# Patient Record
Sex: Male | Born: 1975 | Race: White | Hispanic: No | Marital: Married | Smoking: Never smoker
Health system: Southern US, Community
[De-identification: ages and names within clinical notes are randomized; demographics above are authoritative.]

## PROBLEM LIST (undated history)

## (undated) DIAGNOSIS — F419 Anxiety disorder, unspecified: Secondary | ICD-10-CM

---

## 2013-09-10 ENCOUNTER — Encounter (HOSPITAL_COMMUNITY): Payer: Self-pay | Admitting: Emergency Medicine

## 2013-09-10 ENCOUNTER — Emergency Department (HOSPITAL_COMMUNITY)
Admission: EM | Admit: 2013-09-10 | Discharge: 2013-09-10 | Disposition: A | Discharge status: Home / Self Care | Payer: BC Managed Care – PPO | Attending: Emergency Medicine | Admitting: Emergency Medicine

## 2013-09-10 DIAGNOSIS — G4700 Insomnia, unspecified: Secondary | ICD-10-CM

## 2013-09-10 DIAGNOSIS — F411 Generalized anxiety disorder: Secondary | ICD-10-CM | POA: Insufficient documentation

## 2013-09-10 DIAGNOSIS — Z79899 Other long term (current) drug therapy: Secondary | ICD-10-CM | POA: Insufficient documentation

## 2013-09-10 HISTORY — DX: Anxiety disorder, unspecified: F41.9

## 2013-09-10 LAB — CBG MONITORING, ED: GLUCOSE-CAPILLARY: 89 mg/dL (ref 70–99)

## 2013-09-10 MED ORDER — ZOLPIDEM TARTRATE 10 MG PO TABS: 10.0000 mg | ORAL_TABLET | Freq: Every evening | ORAL | Status: DC | PRN

## 2013-09-10 MED ORDER — LORAZEPAM 1 MG PO TABS: 1.0000 mg | ORAL_TABLET | Freq: Three times a day (TID) | ORAL | Status: DC | PRN

## 2013-09-10 NOTE — ED Notes (Signed)
Per pt, has changed jobs recently in last week.  Pt has had extreme stress and anxiety.  Pt has multiple situations.  Pregnant wife due in 4 weeks, 3 moves in 1 month, child failing school, insomnia, dry mouth.  Hasn't slept in 2 days.  Not eating/drinking well. Hates new job.

## 2013-09-10 NOTE — ED Provider Notes (Signed)
CSN: [id number]     Arrival date & time 09/10/13  1234 History  This chart was scribed for non-physician practitioner Ebbie Ridge, PA-C working with Elwin Mocha, MD by Leone Payor, ED Scribe. This patient was seen in room WTR9/WTR9 and the patient's care was started at 2:34 PM.    Chief Complaint  Patient presents with  . Anxiety  . Insomnia    The history is provided by the patient and the spouse. No language interpreter was used.    HPI Comments: Jamie Thomas is a 38 y.o. male who presents to the Emergency Department complaining of constant, gradually worsened anxiety and insomnia for the past several weeks. Patient states he has been unable to sleep well for several days and no sleep for the past 2 days. Patient has been self-medicating with melatonin and Xanax 0.5 mg without relief. Patient states he recently moved from [place] and had to move 3 times in the past 1 month. He states his wife is 8.5 months pregnant and has another child who is not doing well in school. He reports a decreased appetite and thirst. He states yesterday he felt fatigued with associated lightheadedness and paresthesias to hands. Patient states he used to drink 1-1.5 bottles of wine nightly for many years, which he quit doing 1 week ago. He denies chest pain.   Past Medical History  Diagnosis Date  . Anxiety    History reviewed. No pertinent past surgical history. History reviewed. No pertinent family history. History  Substance Use Topics  . Smoking status: Never Smoker   . Smokeless tobacco: Not on file  . Alcohol Use: Yes     Comment: quit x 1 week, was drinking a bottle of wine per day    Review of Systems  A complete 10 system review of systems was obtained and all systems are negative except as noted in the HPI and PMH.    Allergies  Review of patient's allergies indicates no known allergies.  Home Medications   Prior to Admission medications   Medication Sig Start Date End Date Taking?  Authorizing Provider  ALPRAZolam Prudy Feeler) 0.5 MG tablet Take 0.5 mg by mouth at bedtime as needed for anxiety or sleep.   Yes Historical Provider, MD  ibuprofen (ADVIL,MOTRIN) 200 MG tablet Take 200 mg by mouth every 6 (six) hours as needed for moderate pain.   Yes Historical Provider, MD  Melatonin 3 MG CAPS Take 6 mg by mouth at bedtime as needed (for sleep).   Yes Historical Provider, MD   BP 126/93  Pulse 67  Temp(Src) 97.6 F (36.4 C) (Oral)  Resp 18  SpO2 100% Physical Exam  Nursing note and vitals reviewed. Constitutional: He is oriented to person, place, and time. He appears well-developed and well-nourished.  HENT:  Head: Normocephalic and atraumatic.  Right Ear: External ear normal.  Left Ear: External ear normal.  Cardiovascular: Normal rate, regular rhythm and normal heart sounds.   Pulmonary/Chest: Effort normal and breath sounds normal. No respiratory distress. He has no wheezes. He has no rales.  Abdominal: He exhibits no distension.  Neurological: He is alert and oriented to person, place, and time.  Skin: Skin is warm and dry.  Psychiatric: He has a normal mood and affect. His behavior is normal. Judgment and thought content normal.    ED Course  Procedures (including critical care time)  DIAGNOSTIC STUDIES: Oxygen Saturation is 100% on RA, normal by my interpretation.    COORDINATION OF CARE: 3:02 PM Discussed  treatment plan with pt at bedside and pt agreed to plan.   Labs Review Labs Reviewed  CBG MONITORING, ED    I personally performed the services described in this documentation, which was scribed in my presence. The recorded information has been reviewed and is accurate.    Carlyle DollyChristopher W Amiya Escamilla, PA-C 09/12/13 2303

## 2013-09-10 NOTE — Discharge Instructions (Signed)
Return here as needed. Follow up with a primary care doctor. ° ° ° °Emergency Department Resource Guide °1) Find a Doctor and Pay Out of Pocket °Although you won't have to find out who is covered by your insurance plan, it is a good idea to ask around and get recommendations. You will then need to call the office and see if the doctor you have chosen will accept you as a new patient and what types of options they offer for patients who are self-pay. Some doctors offer discounts or will set up payment plans for their patients who do not have insurance, but you will need to ask so you aren't surprised when you get to your appointment. ° °2) Contact Your Local Health Department °Not all health departments have doctors that can see patients for sick visits, but many do, so it is worth a call to see if yours does. If you don't know where your local health department is, you can check in your phone book. The CDC also has a tool to help you locate your state's health department, and many state websites also have listings of all of their local health departments. ° °3) Find a Walk-in Clinic °If your illness is not likely to be very severe or complicated, you may want to try a walk in clinic. These are popping up all over the country in pharmacies, drugstores, and shopping centers. They're usually staffed by nurse practitioners or physician assistants that have been trained to treat common illnesses and complaints. They're usually fairly quick and inexpensive. However, if you have serious medical issues or chronic medical problems, these are probably not your best option. ° °No Primary Care Doctor: °- Call [facility] at  832-8000 - they can help you locate a primary care doctor that  accepts your insurance, provides certain services, etc. °- Physician Referral Service- 1-800-533-3463 ° °Chronic Pain Problems: °Organization         Address  Phone   Notes  °[facility]  (336) 297-2271 Patients need to  be referred by their primary care doctor.  ° °Medication Assistance: °Organization         Address  Phone   Notes  °[place] [facility] [address] °[place], [place] [address] (336) 641-8030 --Must be a resident of [place] °-- Must have NO insurance coverage whatsoever (no Medicaid/ Medicare, etc.) °-- The pt. MUST have a primary care doctor that directs their care regularly and follows them in the community °  °[facility]  (866) 331-1348   °[employer]  (888) 892-1162   ° °Agencies that provide inexpensive medical care: °Organization         Address  Phone   Notes  °[facility]  (336) 832-8035   °[facility]    (336) 832-7272   °[facility] [address] °[place], [place] [address] (336) 832-4777   °[facility] [address], °[place] (336) 271-4999   °[facility]    (336) 373-0678   °[facility]    (336) 272-1050   °[facility] ° [address], [place] Phone:  (336) 832-4444, Fax:  (336) 832-4440 Hours of Operation:  9 am - 6 pm, M-F.  Also accepts Medicaid/Medicare and self-pay.  ° [facility] ° [address],  Phone: (336) 832-3150, Fax: (336) 832-3151. Hours of Operation:  8:30 am -   5:30 pm, M-F.  Also accepts Medicaid and self-pay.  °[facility] [place] [address], [place] Phone: (336) 878-6027   °[facility] [address], [place], [place] (336)723-1848, Ext. 123 Mondays & Thursdays: 7-9 AM.  First 15 patients are seen on a first come, first serve basis. °  ° °Medicaid-accepting [place] Providers: ° °Organization         Address  Phone   Notes  °[facility] [address], [place] (336) 641-2100 Also accepts self-pay patients.  °[facility] [address], [place] ° (336) 856-9996   °[facility] [address], [place] (336) 288-8857   °[facility] [address] [place] [address], [place] (336) 299-7000   °Veita Bland [address], [place]  ° (336) 373-1557 Only accepts [place] Access Medicaid patients after they have their name applied to their card.  ° °Self-Pay (no insurance) in [place]: ° °Organization         Address  Phone   Notes  °Sickle Cell Patients, [facility] [address], [place] (336) 832-1970   °[facility] [address], [place] (336) 832-4400   °[facility] ° [address], [place] (336) 992-4800   °[facility]/Dr. Osei-Bonsu ° [address] [place] [address], [place] or [address], [place] (336) 841-8500 Phone number for both [place] and [place] locations is the same.  °[facility] [address], [place] (336) 299-0000   °[facility] [address] [place] [address], [place] or [address] (336) 852-7530 °(336) 878-2260   °[facility] [address], [place] (336) 350-1642, phone; (336) 294-5005, fax Sees patients 1st and 3rd Saturday of every month.  Must not qualify for public or private insurance (i.e. Medicaid, Medicare, [place] Health Choice, Veterans' Benefits) • Household income should be no more than 200% of the poverty level •The clinic cannot treat you if you are pregnant or think you are pregnant • Sexually transmitted diseases are not treated at the clinic.  ° ° °Dental Care: °Organization         Address  Phone  Notes  °[facility] [address], [place] (336) 641-6152 Accepts children up to age 21 who are enrolled in Medicaid or [place] Health Choice; pregnant women with a Medicaid card; and children who have applied for Medicaid or [place] Health Choice, but were declined, whose parents can  pay a reduced fee at time of service.  °[facility] [place]  [address], [place] (336) 641-7733 Accepts children up to age 21 who are enrolled in Medicaid or [place] Health Choice; pregnant women with a Medicaid card; and children who have applied for Medicaid or  Health Choice, but were declined, whose parents can pay a reduced fee at time of service.  °[facility] ° [address],  (336) 641-4533 Patients are seen by appointment only. Walk-ins are not accepted. [facility] will see patients 18 years of age and older. °Monday - Tuesday (8am-5pm) °Most Wednesdays (8:30-5pm) °$30 per visit, cash only  °[facility] ° [address], [place] (336) 641-4533 Patients are seen by appointment only. Walk-ins are   not accepted. [facility] will see patients 18 years of age and older. °One Wednesday Evening (Monthly: Volunteer Based).  $30 per visit, cash only  °[facility]  (919) 537-3737 for adults; Children under age 4, call Graduate Pediatric Dentistry at (919) 537-3956. Children aged 4-14, please call (919) 537-3737 to request a pediatric application. ° Dental services are provided in all areas of dental care including fillings, crowns and bridges, complete and partial dentures, implants, gum treatment, root canals, and extractions. Preventive care is also provided. Treatment is provided to both adults and children. °Patients are selected via a lottery and there is often a waiting list. °  °[facility] [address], °[place] ° (336) 763-8833 [email or web address] °  °[facility] [address], [place], [place] (336)723-1848, Ext. 123 Second and Fourth Thursday of each month, opens at 6:30 AM; Clinic ends at 9 AM.  Patients are seen on a first-come first-served basis, and a limited number are seen during each clinic.  ° °[facility] ° [address], [place], [place] (336) 723-7904   Eligibility Requirements °You must have lived in [place], [place], or [place] counties for at least the last three months. °  You cannot be eligible for state or federal sponsored healthcare insurance, including Veterans Administration, Medicaid, or Medicare. °  You generally cannot be eligible for healthcare insurance through your employer.  °  How to apply: °Eligibility screenings are held every Tuesday and Wednesday afternoon from 1:00 pm until 4:00 pm. You do not need an appointment for the interview!  °[facility] [address], [place], [place] 336-631-2330   °[facility]  336-342-8273   °[facility]  336-703-3100   °[facility]  336-570-6415   ° °Behavioral Health Resources in the Community: °Intensive Outpatient Programs °Organization         Address  Phone  Notes  °[facility] [address], [place], [place] 336-878-6098   °[facility] [address], [place], [place] 336-832-9800   °[facility] [address], [place], [place] ° 336-882-2125   °[facility] [address],  °[place], [place] 1-800-853-5163 or 336-641-4981   °Substance Abuse Resources °Organization         Address  Phone  Notes  °[facility]  336-882-2125   °[facility]  336-784-9470   °[facility]  336-285-9073   °[facility]  336-845-3988   °Residential & Outpatient Substance Abuse Program  1-800-659-3381   °Psychological Services °Organization         Address  Phone  Notes  °[facility]  336- 832-9600   °[facility]  336- 378-7881   °[facility] [address], [place] 1-800-853-5163 or 336-641-4981   ° °Mobile Crisis Teams °Organization         Address  Phone  Notes  °[facility], Mobile Crisis Care Unit  1-877-626-1772    °Assertive °[facility] ° [address] [place], [place] 336-834-9664   °Sharon DeEsch [address] °[place] 336-554-5454   ° °Self-Help/Support Groups °Organization         Address  Phone             Notes  °[facility] - variety of support groups  336- 373-1402 Call for more information  °Narcotics Anonymous (NA),   [facility] [address], °[place]  2 meetings at this location  ° °Residential Treatment Programs °Organization         Address  Phone  Notes  °[facility] [address],    °[place]  1-866-801-8205   °[facility] ° [address], [place], [place] 704-293-8524   °[facility] [address], [place] 336-845-3988 Admissions: 8am-3pm M-F  °[facility] [address]    °[place], [place] 336-841-1104   °[facility] [address], [place], [place] 336-379-7146   °[facility] [address]  °[place], [place] 336-285-9073   °[facility] Intensive Outpatient [address], [place], [place] 336-852-3033   °[facility] [address]  °[place], [place] 1-877-615-2722 or 336-784-9470   °[facility] [address] [place], [place] 336-227-7417 Accepts Medicaid  °[facility] [address]  °[place] 1-800-659-3381 Substance Abuse/Addiction Treatment  ° °[place] Behavioral Health Resources °Organization         Address  Phone  Notes  °[facility]  (888) 581-9988   °Julie Brannon, PhD [address] [place], [place]   (336) 349-5553 or (336) 951-0000   °[facility]   [address] °[place], [place] (336) 349-4454   °[facility] [address], [place], [place] (336) 342-8316 Insurance/Medicaid/sponsorship through [facility]  °[facility] [address]                                     [place], [place] (336) 342-8316 Therapy/tele-psych/case  °[facility] [address]  ° [place], [place] (336) 349-2233    °Dr. Arfeen  (336) 349-4544   °[facility] [place]  [employer] [place] [facility] 1) [address], [place] °2) [address], [place] °3)  [address], [place] (336) 349-3220 °(336) 342-7768 ° °(336) 342-8140   °[place] [facility] (336) 342-1394 or (336) 342-3537 (After Hours)    ° ° ° °

## 2013-09-14 NOTE — ED Provider Notes (Signed)
Medical screening examination/treatment/procedure(s) were performed by non-physician practitioner and as supervising physician I was immediately available for consultation/collaboration.   EKG Interpretation None        Jamarcus Laduke, MD 09/14/13 1122 

## 2013-11-10 DIAGNOSIS — F419 Anxiety disorder, unspecified: Secondary | ICD-10-CM | POA: Insufficient documentation

## 2014-02-12 ENCOUNTER — Ambulatory Visit: Payer: Self-pay | Admitting: Podiatry

## 2014-03-09 ENCOUNTER — Encounter: Payer: Self-pay | Admitting: Podiatry

## 2014-03-09 ENCOUNTER — Ambulatory Visit (INDEPENDENT_AMBULATORY_CARE_PROVIDER_SITE_OTHER): Payer: BLUE CROSS/BLUE SHIELD

## 2014-03-09 ENCOUNTER — Ambulatory Visit (INDEPENDENT_AMBULATORY_CARE_PROVIDER_SITE_OTHER): Payer: BLUE CROSS/BLUE SHIELD | Admitting: Podiatry

## 2014-03-09 ENCOUNTER — Other Ambulatory Visit: Payer: Self-pay | Admitting: Podiatry

## 2014-03-09 VITALS — BP 126/76 | HR 67 | Resp 16

## 2014-03-09 DIAGNOSIS — M779 Enthesopathy, unspecified: Secondary | ICD-10-CM

## 2014-03-09 DIAGNOSIS — T560X1A Toxic effect of lead and its compounds, accidental (unintentional), initial encounter: Secondary | ICD-10-CM

## 2014-03-09 DIAGNOSIS — M1A1720 Lead-induced chronic gout, left ankle and foot, without tophus (tophi): Secondary | ICD-10-CM

## 2014-03-09 DIAGNOSIS — M1A1710 Lead-induced chronic gout, right ankle and foot, without tophus (tophi): Secondary | ICD-10-CM

## 2014-03-09 DIAGNOSIS — M2020 Hallux rigidus, unspecified foot: Secondary | ICD-10-CM

## 2014-03-09 MED ORDER — TRIAMCINOLONE ACETONIDE 10 MG/ML IJ SUSP
10.0000 mg | Freq: Once | INTRAMUSCULAR | Status: AC
  Administered 2014-03-09: 10 mg

## 2014-03-09 MED ORDER — PREDNISONE 10 MG PO TABS: ORAL_TABLET | ORAL | Status: DC

## 2014-03-09 NOTE — Progress Notes (Signed)
   Subjective:    Patient ID: Jamie Thomas, male    DOB: 12-22-1975, 39 y.o.   MRN: [id number]  HPI Comments: "I have pain in this foot"  Patient c/o aching 1st MPJ right for few days. He states that he has episodes of pain. The area today is red and swollen. He said on a pain scale last night would have been a 15. He tired icy hot and soaks.  Foot Pain Associated symptoms include arthralgias.      Review of Systems  Musculoskeletal: Positive for arthralgias and gait problem.  All other systems reviewed and are negative.      Objective:   Physical Exam        Assessment & Plan:

## 2014-03-09 NOTE — Progress Notes (Signed)
Subjective:     Patient ID: Jamie CuminsStephen Thomas, male   DOB: [birth year], 39 y.o.   MRN: [id number]  HPI patient states that he's had numerous issues with his big toe joints of both feet and also his ankles especially for the last 4 months. States he does not remember when it started but it's been really bothering me does have a history of gout and has been given a tentative diagnosis of gout but is not done blood work for a long period of time   Review of Systems  All other systems reviewed and are negative.      Objective:   Physical Exam  Constitutional: He is oriented to person, place, and time.  Cardiovascular: Intact distal pulses.   Musculoskeletal: Normal range of motion.  Neurological: He is oriented to person, place, and time.  Skin: Skin is warm.  Nursing note and vitals reviewed.  neurovascular status intact with muscle strength adequate and range of motion of the subtalar and midtarsal joint within normal limits. 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 I am going to send him for extensive blood work to rule out systemic pathology and today I did inject the right first MPJ 3 mg Kenalog 5 mg Xylocaine and then went ahead and placed on a sterile prep 12 day DS Dosepak. Reappoint 2 weeks to review blood work and see how he responded to medicine and to discuss the hallux limitus deformity

## 2014-03-23 ENCOUNTER — Ambulatory Visit: Payer: BLUE CROSS/BLUE SHIELD | Admitting: Podiatry

## 2014-03-23 LAB — ANA: ANA: NEGATIVE

## 2014-03-23 LAB — CBC WITH DIFFERENTIAL/PLATELET
BASOS ABS: 0.1 10*3/uL (ref 0.0–0.1)
Basophils Relative: 1 % (ref 0–1)
Eosinophils Absolute: 0.1 10*3/uL (ref 0.0–0.7)
Eosinophils Relative: 1 % (ref 0–5)
HCT: 47.3 % (ref 39.0–52.0)
Hemoglobin: 16 g/dL (ref 13.0–17.0)
Lymphocytes Relative: 34 % (ref 12–46)
Lymphs Abs: 3.2 10*3/uL (ref 0.7–4.0)
MCH: 30.7 pg (ref 26.0–34.0)
MCHC: 33.8 g/dL (ref 30.0–36.0)
MCV: 90.8 fL (ref 78.0–100.0)
MPV: 9.6 fL (ref 8.6–12.4)
Monocytes Absolute: 0.8 10*3/uL (ref 0.1–1.0)
Monocytes Relative: 8 % (ref 3–12)
NEUTROS ABS: 5.3 10*3/uL (ref 1.7–7.7)
NEUTROS PCT: 56 % (ref 43–77)
PLATELETS: 260 10*3/uL (ref 150–400)
RBC: 5.21 MIL/uL (ref 4.22–5.81)
RDW: 13.9 % (ref 11.5–15.5)
WBC: 9.4 10*3/uL (ref 4.0–10.5)

## 2014-03-23 LAB — URIC ACID: URIC ACID, SERUM: 8.3 mg/dL — AB (ref 4.0–7.8)

## 2014-03-23 LAB — SEDIMENTATION RATE: Sed Rate: 1 mm/hr (ref 0–16)

## 2014-03-23 LAB — RHEUMATOID FACTOR: Rhuematoid fact SerPl-aCnc: <10 IU/mL (ref <=14)

## 2014-03-23 LAB — C-REACTIVE PROTEIN

## 2014-03-30 ENCOUNTER — Encounter: Payer: Self-pay | Admitting: Podiatry

## 2014-03-30 ENCOUNTER — Ambulatory Visit (INDEPENDENT_AMBULATORY_CARE_PROVIDER_SITE_OTHER): Payer: BLUE CROSS/BLUE SHIELD | Admitting: Podiatry

## 2014-03-30 VITALS — BP 124/77 | HR 78 | Resp 16

## 2014-03-30 DIAGNOSIS — M2020 Hallux rigidus, unspecified foot: Secondary | ICD-10-CM

## 2014-03-30 DIAGNOSIS — M1A1710 Lead-induced chronic gout, right ankle and foot, without tophus (tophi): Secondary | ICD-10-CM

## 2014-03-30 DIAGNOSIS — T560X1A Toxic effect of lead and its compounds, accidental (unintentional), initial encounter: Secondary | ICD-10-CM

## 2014-03-30 DIAGNOSIS — M779 Enthesopathy, unspecified: Secondary | ICD-10-CM

## 2014-03-30 MED ORDER — ALLOPURINOL 100 MG PO TABS: 100.0000 mg | ORAL_TABLET | Freq: Every day | ORAL | Status: AC

## 2014-03-30 NOTE — Progress Notes (Signed)
Subjective:     Patient ID: Jamie CuminsStephen Thomas, male   DOB: Sep 03, 1975, 39 y.o.   MRN: [id number]  HPI patient presents stating I'm still having pain in my big toe joints but it's quite a bit better than it was and I know that my uric acid level was elevated. Patient states that he still gets these attacks that can occur and his ankle and other areas   Review of Systems     Objective:   Physical Exam Neurovascular status intact with muscle strength adequate and range of motion subtalar midtarsal joint within normal limits. He does have moderate limitation of motion first MPJ bilateral but no crepitus in the joint and mild discomfort when palpated. 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 We started him on allopurinol 100 mg daily and I did discussed rheumatology consult depending on how symptoms progress

## 2014-06-30 ENCOUNTER — Ambulatory Visit (INDEPENDENT_AMBULATORY_CARE_PROVIDER_SITE_OTHER): Payer: BLUE CROSS/BLUE SHIELD | Admitting: Podiatry

## 2014-06-30 ENCOUNTER — Encounter: Payer: Self-pay | Admitting: Podiatry

## 2014-06-30 VITALS — BP 132/70 | HR 67 | Resp 12

## 2014-06-30 DIAGNOSIS — T560X1A Toxic effect of lead and its compounds, accidental (unintentional), initial encounter: Secondary | ICD-10-CM | POA: Diagnosis not present

## 2014-06-30 DIAGNOSIS — M1A1710 Lead-induced chronic gout, right ankle and foot, without tophus (tophi): Secondary | ICD-10-CM | POA: Diagnosis not present

## 2014-06-30 DIAGNOSIS — M2020 Hallux rigidus, unspecified foot: Secondary | ICD-10-CM | POA: Diagnosis not present

## 2014-06-30 NOTE — Progress Notes (Deleted)
Subjective:     Patient ID: Jamie Thomas, male   DOB: 1975-12-19, 39 y.o.   MRN: [id number]  HPI Patient wants to know if he should be concerned or if anything can be done about redness and swelling in mpj, ankles and sides of toes  Review of Systems     Objective:   Physical Exam     Assessment:     ***    Plan:     ***

## 2014-07-02 NOTE — Progress Notes (Signed)
Subjective:     Patient ID: Jamie CuminsStephen Thomas, male   DOB: 26-Jan-1976, 39 y.o.   MRN: [id number]  HPI patient states his feet are feeling some better but he continues to get redness around the big toe joint and he is concerned about the fact that he still has different discomfort at different times   Review of Systems     Objective:   Physical Exam Neurovascular status intact muscle strength adequate range of motion within normal limits and I discussed with him the probable gout activity that he has along with hallux limitus deformity that is present left over right.    Assessment:     Difficult to ascertain between hallux limitus type symptomatology versus possibility low level gout    Plan:     At this time we're going to have him take his allopurinol on a regular basis that we had prescribed for him and we'll in a watch and see how he reacts to this. I also want him to work with his family physician for the long-term and he may require some form of uric acid lowering medication for the remainder of his life we will leave his joint alone but someday may require correction

## 2014-07-12 ENCOUNTER — Ambulatory Visit (INDEPENDENT_AMBULATORY_CARE_PROVIDER_SITE_OTHER): Payer: Self-pay | Admitting: Family Medicine

## 2014-07-12 DIAGNOSIS — R69 Illness, unspecified: Secondary | ICD-10-CM

## 2014-07-12 NOTE — Progress Notes (Signed)
NO SHOW

## 2014-10-27 ENCOUNTER — Ambulatory Visit: Payer: BLUE CROSS/BLUE SHIELD | Admitting: Podiatry

## 2016-03-08 ENCOUNTER — Ambulatory Visit (INDEPENDENT_AMBULATORY_CARE_PROVIDER_SITE_OTHER): Payer: BLUE CROSS/BLUE SHIELD

## 2016-03-08 ENCOUNTER — Ambulatory Visit (INDEPENDENT_AMBULATORY_CARE_PROVIDER_SITE_OTHER): Payer: BLUE CROSS/BLUE SHIELD | Admitting: Podiatry

## 2016-03-08 DIAGNOSIS — M25572 Pain in left ankle and joints of left foot: Secondary | ICD-10-CM

## 2016-03-08 DIAGNOSIS — M7750 Other enthesopathy of unspecified foot: Secondary | ICD-10-CM

## 2016-03-08 DIAGNOSIS — M1000 Idiopathic gout, unspecified site: Secondary | ICD-10-CM | POA: Diagnosis not present

## 2016-03-08 DIAGNOSIS — M779 Enthesopathy, unspecified: Secondary | ICD-10-CM

## 2016-03-08 MED ORDER — METHYLPREDNISOLONE 4 MG PO TBPK: ORAL_TABLET | ORAL | 0 refills | Status: DC

## 2016-03-08 MED ORDER — TRIAMCINOLONE ACETONIDE 10 MG/ML IJ SUSP
10.0000 mg | Freq: Once | INTRAMUSCULAR | Status: AC
  Administered 2016-03-08: 10 mg

## 2016-03-08 NOTE — Patient Instructions (Signed)

## 2016-04-04 ENCOUNTER — Encounter: Payer: Self-pay | Admitting: Podiatry

## 2016-04-04 ENCOUNTER — Ambulatory Visit: Payer: BLUE CROSS/BLUE SHIELD

## 2016-04-04 ENCOUNTER — Ambulatory Visit (INDEPENDENT_AMBULATORY_CARE_PROVIDER_SITE_OTHER): Payer: BLUE CROSS/BLUE SHIELD | Admitting: Podiatry

## 2016-04-04 DIAGNOSIS — M25572 Pain in left ankle and joints of left foot: Secondary | ICD-10-CM

## 2016-04-04 DIAGNOSIS — M25571 Pain in right ankle and joints of right foot: Secondary | ICD-10-CM

## 2016-04-04 DIAGNOSIS — M1000 Idiopathic gout, unspecified site: Secondary | ICD-10-CM | POA: Diagnosis not present

## 2016-04-04 DIAGNOSIS — M7750 Other enthesopathy of unspecified foot: Secondary | ICD-10-CM | POA: Diagnosis not present

## 2016-04-04 MED ORDER — TRIAMCINOLONE ACETONIDE 10 MG/ML IJ SUSP
10.0000 mg | Freq: Once | INTRAMUSCULAR | Status: AC
  Administered 2016-04-04: 10 mg

## 2016-04-05 NOTE — Progress Notes (Signed)
Subjective:     Patient ID: Jamie Thomas, male   DOB: Jan 04, 1976, 41 y.o.   MRN: [id number]  HPI patient states the pain has improved quite a bit but it still present up in the ankle joint and I feel like I'm still dealing with something that systemic inflammatory   Review of Systems     Objective:   Physical Exam Neurovascular status intact with patient continuing to have dorsal medial ankle pain left that's localized just medial to the anterior tibial tendon. Patient does not have pain when I move the joint and it is more in deep palpation of the joint surface with no redness or swelling noted. Patient had anterior tibial tendon check today and it could be slightly weak but it appears to be functioning well bilateral    Assessment:     Difficult to say as to why he is developing these problems at this age but does not appear to be an osteoarthritic condition    Plan:     Reviewed at great length and I am going to send him for blood work to try to understand what may be elevated currently. I did do a careful dorsal medial ankle injection 3 mg Kenalog 5 mg Xylocaine and we will see him back again in 2 weeks and review the results of his blood work

## 2016-04-10 ENCOUNTER — Ambulatory Visit: Payer: BLUE CROSS/BLUE SHIELD | Admitting: Podiatry

## 2016-04-18 ENCOUNTER — Ambulatory Visit: Payer: BLUE CROSS/BLUE SHIELD | Admitting: Podiatry

## 2016-05-06 ENCOUNTER — Encounter: Payer: Self-pay | Admitting: Podiatry

## 2016-05-06 ENCOUNTER — Ambulatory Visit (INDEPENDENT_AMBULATORY_CARE_PROVIDER_SITE_OTHER): Payer: BLUE CROSS/BLUE SHIELD | Admitting: Podiatry

## 2016-05-06 DIAGNOSIS — M779 Enthesopathy, unspecified: Secondary | ICD-10-CM

## 2016-05-06 DIAGNOSIS — T148XXA Other injury of unspecified body region, initial encounter: Secondary | ICD-10-CM

## 2016-05-06 MED ORDER — TRIAMCINOLONE ACETONIDE 10 MG/ML IJ SUSP
10.0000 mg | Freq: Once | INTRAMUSCULAR | Status: AC
  Administered 2016-05-06: 10 mg

## 2016-05-07 ENCOUNTER — Ambulatory Visit: Payer: BLUE CROSS/BLUE SHIELD | Admitting: Podiatry

## 2016-05-07 NOTE — [place] Notes (Signed)
Subjective:     Patient ID: Jamie Thomas, male   DOB: 11/23/1975, 41 y.o.   MRN: [id number]  HPI patient states he still having pain in his foot but it's moved from the top more toward the inside and states that he's very frustrated by 2 months of pain   Review of Systems     Objective:   Physical Exam Neurovascular status intact with discomfort in the posterior tibial tendon as it comes across the medial malleolus with no indications of muscle strength loss or other pathology. Also presents with blood work today    Assessment:     Tendinitis that continues to be a problem for him with the possibility given the length of time it's hurting and the change in direction that there could be some sheath involvement or possible tear posterior tibial or anterior tibial interstitial type tear    Plan:     H&P and conditions reviewed at great length. At this point were to go ahead and immobilize and placed into a air fracture walker and I did do a medial careful injection of posterior tib explaining risk. I do think he would be best to have an MRI to try to rule out any form interstitial tear and he is scheduled for MRI and I will see him back afterwards and I reviewed his blood work indicating positive AMA but no indications of pathology from this standpoint of sedimentation rate or any other numbers. If symptoms were to persist and we cannot find an answer we may consider a rheumatologist consult

## 2016-05-17 ENCOUNTER — Ambulatory Visit
Admission: RE | Admit: 2016-05-17 | Discharge: 2016-05-17 | Disposition: A | Discharge status: Home / Self Care | Payer: BLUE CROSS/BLUE SHIELD | Source: Ambulatory Visit | Attending: Podiatry | Admitting: Podiatry

## 2016-05-21 ENCOUNTER — Telehealth: Payer: Self-pay | Admitting: *Deleted

## 2016-05-21 DIAGNOSIS — M25571 Pain in right ankle and joints of right foot: Secondary | ICD-10-CM

## 2016-05-21 DIAGNOSIS — G8929 Other chronic pain: Secondary | ICD-10-CM

## 2016-05-21 DIAGNOSIS — M779 Enthesopathy, unspecified: Secondary | ICD-10-CM

## 2016-05-21 DIAGNOSIS — M25572 Pain in left ankle and joints of left foot: Secondary | ICD-10-CM

## 2016-05-21 NOTE — Telephone Encounter (Addendum)
Pt's wife, Wynona Canes called for MRI Results. Left message informing Wynona Canes that the results were available, pt needed to remain in the walking boot, and make an appt to discuss results with Dr. Charlsie Merles. Pt called for more information concerning MRI results. Left message informing pt I could not give out results without Dr. Charlsie Merles advising, but from what I had seen he needed to be in the boot and to get an appt as soon as possible.05/22/2016-Pt called states he appreciates the calls back,but would really like to know the results of the MRI so he could enjoy his honeymoon. Dr. Charlsie Merles states there is a tear in the deltoid ligament, unable to tell if it is old or new, care should be for pt's comfort and function level, may need surgical repair in the future. I informed pt of Dr. Beverlee Nims review of results. Pt asked if he could go for a brisk walk or go jogging. I told pt he needed to treat himself like an athlete and recuperate, light non-weight bearing stretches through the day and before and after walking, and make an appt with Dr. Charlsie Merles when return. Pt asked if I were a betting person would I think he would need surgery, and I told him a lot depended on how he took care of himself and discuss with Dr. Charlsie Merles. Pt states understanding.06/03/2016-faxed referral form and demographics to [facility].

## 2016-05-31 ENCOUNTER — Ambulatory Visit (INDEPENDENT_AMBULATORY_CARE_PROVIDER_SITE_OTHER): Payer: BLUE CROSS/BLUE SHIELD | Admitting: Podiatry

## 2016-05-31 ENCOUNTER — Encounter: Payer: Self-pay | Admitting: Podiatry

## 2016-05-31 DIAGNOSIS — M25571 Pain in right ankle and joints of right foot: Secondary | ICD-10-CM

## 2016-05-31 DIAGNOSIS — M779 Enthesopathy, unspecified: Secondary | ICD-10-CM | POA: Diagnosis not present

## 2016-05-31 DIAGNOSIS — M25572 Pain in left ankle and joints of left foot: Secondary | ICD-10-CM | POA: Diagnosis not present

## 2016-06-03 NOTE — [place] Notes (Signed)
Subjective:    Patient ID: Jamie Thomas, male   DOB: 41 y.o.   MRN: [id number]   HPI patient continues to experience quite a bit of discomfort around the deltoid ligament left where it appears on MRI there is been partial tear with changes within the medial malleolus is been going on now for over 4 months and it did occur acutely without any indication of overt trauma    ROS      Objective:  Physical Exam Neurovascular status intact muscle strength adequate with quite a bit of discomfort around the deltoid ligament left and into the medial malleolus localized in nature with no other indications of pathology and no swelling indicative of a systemic disease    Assessment:    Appears to be a localized process with again no history of significant trauma which is confusing in this particular instance     Plan:    H&P condition reviewed and discussed with patient. At this point I want him to go to complete immobilization and we will start aggressive physical therapy for 4 weeks and if symptoms do not improve I may consider sending him out for second opinion for the possibility of surgical intervention. Patient will be seen back by [place] and we will decide if anything else is appropriate to try to help him

## 2016-11-20 NOTE — [place] Notes (Signed)
HPI patient states that he is having pain in the ankle joint and he  feels like he dealing with something that systemic inflammatory   Review of Systems     Objective:   Physical Exam Neurovascular status intact with patient continuing to have dorsal medial ankle pain left that's localized just medial to the anterior tibial tendon. Patient does not have pain when I move the joint and it is more in deep palpation of the joint surface with no redness or swelling noted. Patient had anterior tibial tendon check today and it could be slightly weak but it appears to be functioning well bilateral    Assessment:     Difficult to say as to why he is developing these problems at this age but does not appear to be an osteoarthritic condition    Plan:     Reviewed at great length and gave instructions on gout diet and foods to avoidI did do a careful dorsal medial ankle injection 3 mg Kenalog 5 mg Xylocaine and we will see him back again in 2 weeks

## 2017-01-17 DIAGNOSIS — E785 Hyperlipidemia, unspecified: Secondary | ICD-10-CM | POA: Insufficient documentation

## 2017-08-25 ENCOUNTER — Encounter: Payer: BLUE CROSS/BLUE SHIELD | Admitting: Podiatry

## 2017-08-29 ENCOUNTER — Encounter: Payer: Self-pay | Admitting: Podiatry

## 2017-08-29 ENCOUNTER — Ambulatory Visit (INDEPENDENT_AMBULATORY_CARE_PROVIDER_SITE_OTHER): Payer: BLUE CROSS/BLUE SHIELD

## 2017-08-29 ENCOUNTER — Ambulatory Visit: Payer: BLUE CROSS/BLUE SHIELD | Admitting: Podiatry

## 2017-08-29 ENCOUNTER — Other Ambulatory Visit: Payer: Self-pay | Admitting: Podiatry

## 2017-08-29 DIAGNOSIS — M2021 Hallux rigidus, right foot: Secondary | ICD-10-CM

## 2017-08-29 DIAGNOSIS — M79675 Pain in left toe(s): Secondary | ICD-10-CM

## 2017-08-29 DIAGNOSIS — M2022 Hallux rigidus, left foot: Secondary | ICD-10-CM

## 2017-08-29 DIAGNOSIS — M79671 Pain in right foot: Secondary | ICD-10-CM | POA: Diagnosis not present

## 2017-08-29 NOTE — Patient Instructions (Signed)
Pre-Operative Instructions  Congratulations, you have decided to take an important step towards improving your quality of life.  You can be assured that the doctors and staff at [facility] will be with you every step of the way.  Here are some important things you should know:  1. Plan to be at the surgery center/hospital at least 1 (one) hour prior to your scheduled time, unless otherwise directed by the surgical center/hospital staff.  You must have a responsible adult accompany you, remain during the surgery and drive you home.  Make sure you have directions to the surgical center/hospital to ensure you arrive on time. 2. If you are having surgery at [facility] or [facility], you will need a copy of your medical history and physical form from your family physician within one month prior to the date of surgery. We will give you a form for your primary physician to complete.  3. We make every effort to accommodate the date you request for surgery.  However, there are times where surgery dates or times have to be moved.  We will contact you as soon as possible if a change in schedule is required.   4. No aspirin/ibuprofen for one week before surgery.  If you are on aspirin, any non-steroidal anti-inflammatory medications (Mobic, Aleve, Ibuprofen) should not be taken seven (7) days prior to your surgery.  You make take Tylenol for pain prior to surgery.  5. Medications - If you are taking daily heart and blood pressure medications, seizure, reflux, allergy, asthma, anxiety, pain or diabetes medications, make sure you notify the surgery center/hospital before the day of surgery so they can tell you which medications you should take or avoid the day of surgery. 6. No food or drink after midnight the night before surgery unless directed otherwise by surgical center/hospital staff. 7. No alcoholic beverages 24-hours prior to surgery.  No smoking 24-hours prior or 24-hours after  surgery. 8. Wear loose pants or shorts. They should be loose enough to fit over bandages, boots, and casts. 9. Don't wear slip-on shoes. Sneakers are preferred. 10. Bring your boot with you to the surgery center/hospital.  Also bring crutches or a walker if your physician has prescribed it for you.  If you do not have this equipment, it will be provided for you after surgery. 11. If you have not been contacted by the surgery center/hospital by the day before your surgery, call to confirm the date and time of your surgery. 12. Leave-time from work may vary depending on the type of surgery you have.  Appropriate arrangements should be made prior to surgery with your employer. 13. Prescriptions will be provided immediately following surgery by your doctor.  Fill these as soon as possible after surgery and take the medication as directed. Pain medications will not be refilled on weekends and must be approved by the doctor. 14. Remove nail polish on the operative foot and avoid getting pedicures prior to surgery. 15. Wash the night before surgery.  The night before surgery wash the foot and leg well with water and the antibacterial soap provided. Be sure to pay special attention to beneath the toenails and in between the toes.  Wash for at least three (3) minutes. Rinse thoroughly with water and dry well with a towel.  Perform this wash unless told not to do so by your physician.  Enclosed: 1 Ice pack (please put in freezer the night before surgery)   1 Hibiclens skin cleaner     Pre-op instructions  If you have any questions regarding the instructions, please do not hesitate to call our office.  [place]: [address], [place], [place] [address] -- [phone]  [place]: [address] , [place] [address] -- [phone]  [place]: [address]  [place], [place] [address] -- [phone]  [place]: [address], [address], [place], [place] [address] -- [phone]  Website:  [email or web address] 

## 2017-08-30 NOTE — [place] Notes (Signed)
Subjective:   Patient ID: Jamie Thomas, male   DOB: 42 y.o.   MRN: [id number]   HPI Patient states have had more trouble over the last few years of my big toe joint with the left bothering me more than my right but my right more acute.  Patient states it hurts when he tries to be active or tries to exercise   ROS      Objective:  Physical Exam  Neurovascular status intact muscle strength is adequate with narrowing of the joint surface first MPJ left with inflammation and fluid around the joint surface and reduced range of motion.  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  I do think this patient can have at the compression osteotomy of the first metatarsal left with removal of bone spur and plantar flexion of the bone and it offers a chance of solving this problem without joint fusion or implant.  Patient wants to proceed with this and at this point I have recommended a biplanar osteotomy and allow him to read consent form going over alternative treatments and complications.  He understands he may have cartilage damage and ultimately may require a fusion or implant procedure.  Patient reads over consent form and after extensive review signed consent form understanding risk and I did dispense a short air fracture walker with explanations on usage.  Patient for the right will not have any procedure done but will try to soak it and wear wider shoes he does understand total recovery will be 6 months to 1 year  X-ray indicates that there is spurring of the dorsal first metatarsal left with moderate elevation of the first metatarsal segment with mild deformity noted right

## 2017-09-19 ENCOUNTER — Ambulatory Visit: Payer: BLUE CROSS/BLUE SHIELD | Admitting: Podiatry

## 2017-09-25 ENCOUNTER — Ambulatory Visit: Payer: BLUE CROSS/BLUE SHIELD | Admitting: Podiatry

## 2017-09-25 ENCOUNTER — Encounter: Payer: Self-pay | Admitting: Podiatry

## 2017-09-25 DIAGNOSIS — M2022 Hallux rigidus, left foot: Secondary | ICD-10-CM

## 2017-09-25 DIAGNOSIS — M25571 Pain in right ankle and joints of right foot: Secondary | ICD-10-CM

## 2017-09-25 NOTE — Patient Instructions (Signed)
Pre-Operative Instructions  Congratulations, you have decided to take an important step towards improving your quality of life.  You can be assured that the doctors and staff at [facility] will be with you every step of the way.  Here are some important things you should know:  1. Plan to be at the surgery center/hospital at least 1 (one) hour prior to your scheduled time, unless otherwise directed by the surgical center/hospital staff.  You must have a responsible adult accompany you, remain during the surgery and drive you home.  Make sure you have directions to the surgical center/hospital to ensure you arrive on time. 2. If you are having surgery at [facility] or [facility], you will need a copy of your medical history and physical form from your family physician within one month prior to the date of surgery. We will give you a form for your primary physician to complete.  3. We make every effort to accommodate the date you request for surgery.  However, there are times where surgery dates or times have to be moved.  We will contact you as soon as possible if a change in schedule is required.   4. No aspirin/ibuprofen for one week before surgery.  If you are on aspirin, any non-steroidal anti-inflammatory medications (Mobic, Aleve, Ibuprofen) should not be taken seven (7) days prior to your surgery.  You make take Tylenol for pain prior to surgery.  5. Medications - If you are taking daily heart and blood pressure medications, seizure, reflux, allergy, asthma, anxiety, pain or diabetes medications, make sure you notify the surgery center/hospital before the day of surgery so they can tell you which medications you should take or avoid the day of surgery. 6. No food or drink after midnight the night before surgery unless directed otherwise by surgical center/hospital staff. 7. No alcoholic beverages 24-hours prior to surgery.  No smoking 24-hours prior or 24-hours after  surgery. 8. Wear loose pants or shorts. They should be loose enough to fit over bandages, boots, and casts. 9. Don't wear slip-on shoes. Sneakers are preferred. 10. Bring your boot with you to the surgery center/hospital.  Also bring crutches or a walker if your physician has prescribed it for you.  If you do not have this equipment, it will be provided for you after surgery. 11. If you have not been contacted by the surgery center/hospital by the day before your surgery, call to confirm the date and time of your surgery. 12. Leave-time from work may vary depending on the type of surgery you have.  Appropriate arrangements should be made prior to surgery with your employer. 13. Prescriptions will be provided immediately following surgery by your doctor.  Fill these as soon as possible after surgery and take the medication as directed. Pain medications will not be refilled on weekends and must be approved by the doctor. 14. Remove nail polish on the operative foot and avoid getting pedicures prior to surgery. 15. Wash the night before surgery.  The night before surgery wash the foot and leg well with water and the antibacterial soap provided. Be sure to pay special attention to beneath the toenails and in between the toes.  Wash for at least three (3) minutes. Rinse thoroughly with water and dry well with a towel.  Perform this wash unless told not to do so by your physician.  Enclosed: 1 Ice pack (please put in freezer the night before surgery)   1 Hibiclens skin cleaner     Pre-op instructions  If you have any questions regarding the instructions, please do not hesitate to call our office.  [place]: [address], [place], [place] [address] -- [phone]  [place]: [address] [place], [place] [address] -- [phone]  [place]: [address]  [place], [place] [address] -- [phone]  [place]: [address], [place], [place] [address] -- [phone]  Website:  [email or web address] 

## 2017-09-25 NOTE — [place] Notes (Signed)
Subjective:   Patient ID: Jamie CuminsStephen Thomas, male   DOB: 42 y.o.   MRN: [id number]   HPI Patient states he is ready to schedule his surgery and is also concerned because he is developing some increased inflammation around the big toe joint of his right foot.  States the left remains his primary problem   ROS      Objective:  Physical Exam  Neurovascular status intact with inflammatory hallux limitus condition left over right with reduced range of motion bilateral with pain in the joint left over right     Assessment:  Significant hallux limitus deformity left over right with inflammatory capsulitis large bone spur formation left over right     Plan:  H&P condition reviewed and at this point I discussed the surgery which we have already reviewed we reviewed again the procedure risk and alternative treatments and he wants surgery for the left and is scheduled for this in October.  For the right I will also at the same time inject the joint when he has a surgery left to try to reduce the inflammation he knows at one point future will need surgery on that foot.  I reviewed his x-rays with him today

## 2017-10-03 ENCOUNTER — Ambulatory Visit: Payer: BLUE CROSS/BLUE SHIELD | Admitting: Podiatry

## 2017-10-03 ENCOUNTER — Encounter: Payer: Self-pay | Admitting: Podiatry

## 2017-10-03 DIAGNOSIS — M1000 Idiopathic gout, unspecified site: Secondary | ICD-10-CM | POA: Diagnosis not present

## 2017-10-03 DIAGNOSIS — M779 Enthesopathy, unspecified: Secondary | ICD-10-CM

## 2017-10-03 DIAGNOSIS — M2022 Hallux rigidus, left foot: Secondary | ICD-10-CM

## 2017-10-03 MED ORDER — DICLOFENAC SODIUM 75 MG PO TBEC: 75.0000 mg | DELAYED_RELEASE_TABLET | Freq: Two times a day (BID) | ORAL | 2 refills | Status: AC

## 2017-10-03 MED ORDER — ALLOPURINOL 100 MG PO TABS: 100.0000 mg | ORAL_TABLET | Freq: Every day | ORAL | 6 refills | Status: AC

## 2017-10-03 MED ORDER — TRIAMCINOLONE ACETONIDE 10 MG/ML IJ SUSP
10.0000 mg | Freq: Once | INTRAMUSCULAR | Status: AC
  Administered 2017-10-03: 10 mg

## 2017-10-03 NOTE — [place] Notes (Signed)
Subjective:   Patient ID: Verda CuminsStephen Henslee, male   DOB: 42 y.o.   MRN: [id number]   HPI Patient states a new medicine as best you can do now before I have my surgery in a couple months.  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

## 2017-10-13 NOTE — [place] Notes (Signed)
This encounter was created in error - please disregard.

## 2017-11-25 ENCOUNTER — Encounter: Payer: Self-pay | Admitting: Podiatry

## 2017-11-30 ENCOUNTER — Encounter: Payer: Self-pay | Admitting: Podiatry

## 2017-11-30 NOTE — [place] Notes (Signed)
Patient called asking about showering. Called patient back but went to voicemail. called again and patient had already showered and took off his dressing. He was advised to come into the office and his dressing was replaced. Wound well appearing with no signs of infection.

## 2017-12-03 ENCOUNTER — Ambulatory Visit (INDEPENDENT_AMBULATORY_CARE_PROVIDER_SITE_OTHER): Payer: BLUE CROSS/BLUE SHIELD | Admitting: Podiatry

## 2017-12-03 ENCOUNTER — Telehealth: Payer: Self-pay | Admitting: Podiatry

## 2017-12-03 ENCOUNTER — Ambulatory Visit (INDEPENDENT_AMBULATORY_CARE_PROVIDER_SITE_OTHER): Payer: BLUE CROSS/BLUE SHIELD

## 2017-12-03 ENCOUNTER — Encounter: Payer: Self-pay | Admitting: Podiatry

## 2017-12-03 VITALS — Temp 98.1°F

## 2017-12-03 DIAGNOSIS — M2022 Hallux rigidus, left foot: Secondary | ICD-10-CM

## 2017-12-03 DIAGNOSIS — Z9889 Other specified postprocedural states: Secondary | ICD-10-CM

## 2017-12-03 NOTE — Telephone Encounter (Signed)
I saw Dr. Charlsie Merles today and I'm calling about the PT I'm supposed to receive. I don't recall if I'm supposed to follow up with an office or is someone supposed to contact me? Also, when will I start PT? My number is [phone].

## 2017-12-04 NOTE — Telephone Encounter (Signed)
They should contact him today to setup. If hasn't heard let us know and we can confirm that they are going to call

## 2017-12-04 NOTE — [place] Notes (Signed)
Subjective:   Patient ID: Jamie Thomas, male   DOB: 42 y.o.   MRN: [id number]   HPI Patient states doing real well with surgery with minimal discomfort and states that he is ready to get the boot off and start wearing a surgical shoe.   ROS      Objective:  Physical Exam  Neurovascular status intact negative Homans sign noted wound edges well coapted first metatarsal left with range of motion adequate with no current crepitus of the joint with the right also improved after injection     Assessment:  Doing well post osteotomy first metatarsal left foot     Plan:  H&P reviewed condition x-rays and at this point I have recommended continued immobilization compression elevation and physical therapy to help with the motion of the joint surface.  Discussed his results of pathology indicating probable gout and that I do want him to start medicine which he has at home and he will be seen back to recheck 3 weeks or earlier if needed  X-ray indicates the osteotomy is healing well joint congruous no spurs are noted fixation in place

## 2017-12-04 NOTE — Telephone Encounter (Signed)
Left message informing pt Dr. Charlsie Merles had referred to [facility] - In-office [phone] and they should call to schedule.

## 2017-12-04 NOTE — Addendum Note (Signed)
Addended by: Alphia Kava D on: 12/04/2017 12:11 PM   Modules accepted: Orders

## 2017-12-17 ENCOUNTER — Ambulatory Visit (INDEPENDENT_AMBULATORY_CARE_PROVIDER_SITE_OTHER): Payer: BLUE CROSS/BLUE SHIELD

## 2017-12-17 ENCOUNTER — Encounter: Payer: Self-pay | Admitting: Podiatry

## 2017-12-17 ENCOUNTER — Ambulatory Visit (INDEPENDENT_AMBULATORY_CARE_PROVIDER_SITE_OTHER): Payer: BLUE CROSS/BLUE SHIELD | Admitting: Podiatry

## 2017-12-17 DIAGNOSIS — M2022 Hallux rigidus, left foot: Secondary | ICD-10-CM

## 2017-12-17 NOTE — [place] Notes (Signed)
Subjective:   Patient ID: Jamie Thomas, male   DOB: 42 y.o.   MRN: [id number]   HPI Patient states doing real well with surgery and due to start physical therapy to try to increase the motion   ROS      Objective:  Physical Exam  Neurovascular status intact with patient's left first MPJ healing very well with wound edges well coapted and satisfactory motion for this.  Postop with no crepitus     Assessment:  Doing well post hallux limitus surgery left     Plan:  H&P condition reviewed and discussed continuation of exercises and he is to start physical therapy to improve motion.  Continue immobilization as needed elevation compression and reappoint 4 weeks or earlier if needed  X-ray indicates that there is good healing of the osteotomy with joint congruous fixation in place

## 2018-01-21 ENCOUNTER — Other Ambulatory Visit: Payer: Self-pay | Admitting: Podiatry

## 2018-01-21 ENCOUNTER — Ambulatory Visit (INDEPENDENT_AMBULATORY_CARE_PROVIDER_SITE_OTHER): Payer: BLUE CROSS/BLUE SHIELD | Admitting: Podiatry

## 2018-01-21 ENCOUNTER — Ambulatory Visit (INDEPENDENT_AMBULATORY_CARE_PROVIDER_SITE_OTHER): Payer: BLUE CROSS/BLUE SHIELD

## 2018-01-21 ENCOUNTER — Encounter: Payer: Self-pay | Admitting: Podiatry

## 2018-01-21 DIAGNOSIS — M79672 Pain in left foot: Secondary | ICD-10-CM

## 2018-01-21 DIAGNOSIS — M2012 Hallux valgus (acquired), left foot: Secondary | ICD-10-CM | POA: Diagnosis not present

## 2018-01-21 DIAGNOSIS — M79671 Pain in right foot: Secondary | ICD-10-CM

## 2018-01-21 DIAGNOSIS — M2011 Hallux valgus (acquired), right foot: Secondary | ICD-10-CM

## 2018-01-21 DIAGNOSIS — M2021 Hallux rigidus, right foot: Secondary | ICD-10-CM

## 2018-01-21 DIAGNOSIS — M2022 Hallux rigidus, left foot: Secondary | ICD-10-CM

## 2018-01-21 NOTE — [place] Notes (Signed)
Subjective:   Patient ID: Verda CuminsStephen Dugar, male   DOB: 42 y.o.   MRN: [id number]   HPI Patient states left big toe joint is doing pretty well but he is noticing some increased thickness of the bone on the right and mild symptoms   ROS      Objective:  Physical Exam  Neurovascular status intact with excellent healing first MPJ left with good range of motion in the right showing restricted motion and moderate discomfort on the dorsal medial aspect     Assessment:  Doing very well post biplanar osteotomy first metatarsal left foot with good range of motion no crepitus and on the right reduced motion with spur formation     Plan:  H&P x-rays reviewed and today I have recommended for the right one at one point surgical be necessary.  We reviewed what would be required and I do not want him to wait as long as the left one and for the left one I want him to resume normal activities and will be seen back in 10 weeks  X-rays indicate osteotomies healing well fixation in place joint congruent with the beginning of spurring on the right one with mild elevation of the first metatarsal segment

## 2018-03-23 ENCOUNTER — Encounter: Payer: BLUE CROSS/BLUE SHIELD | Admitting: Podiatry

## 2018-03-25 ENCOUNTER — Encounter: Payer: BLUE CROSS/BLUE SHIELD | Admitting: Podiatry

## 2018-04-02 ENCOUNTER — Encounter: Payer: BLUE CROSS/BLUE SHIELD | Admitting: Podiatry

## 2018-09-23 ENCOUNTER — Other Ambulatory Visit: Payer: Self-pay | Admitting: Podiatry

## 2018-09-23 ENCOUNTER — Other Ambulatory Visit: Payer: Self-pay

## 2018-09-23 ENCOUNTER — Encounter: Payer: Self-pay | Admitting: Podiatry

## 2018-09-23 ENCOUNTER — Ambulatory Visit (INDEPENDENT_AMBULATORY_CARE_PROVIDER_SITE_OTHER): Payer: BC Managed Care – PPO | Admitting: Podiatry

## 2018-09-23 ENCOUNTER — Ambulatory Visit (INDEPENDENT_AMBULATORY_CARE_PROVIDER_SITE_OTHER): Payer: BC Managed Care – PPO

## 2018-09-23 VITALS — Temp 97.2°F

## 2018-09-23 DIAGNOSIS — M779 Enthesopathy, unspecified: Secondary | ICD-10-CM | POA: Diagnosis not present

## 2018-09-23 DIAGNOSIS — M79671 Pain in right foot: Secondary | ICD-10-CM

## 2018-09-23 DIAGNOSIS — M1000 Idiopathic gout, unspecified site: Secondary | ICD-10-CM

## 2018-09-23 NOTE — [place] Notes (Signed)
Subjective:   Patient ID: Jamie Thomas, male   DOB: 43 y.o.   MRN: [id number]   HPI Patient presents stating the big toe joint right foot has become very sore over the last few weeks and admits that he had drank a lot of red wine when it started and the left one that we fixed is doing very well with no problems   ROS      Objective:  Physical Exam  Neurovascular status intact with inflammatory capsulitis around the first MPJ right foot with reduced range of motion of the joint with well-healed surgical site left first MPJ good motion with no pain.  There is some warmth and redness around the first MPJ also noted     Assessment:  Inflammatory capsulitis first MPJ right along with probable gout-like symptomatology and also hallux limitus condition with improved left from surgery     Plan:  H&P conditions reviewed and today I did sterile prep and injected around the first MPJ 3 mg Kenalog 5 mg Xylocaine and went ahead and discussed gout and gave him a sheet for gout diet and discussed allopurinol which she has not been taking and that at one point he may need to resume.  Patient will be seen back to recheck  X-ray indicates that the first MPJ right has some spur formation with narrowing of the joint surface elevation of the first metatarsal but no significant movement from the previous visit

## 2018-09-23 NOTE — Patient Instructions (Signed)

## 2018-12-16 IMAGING — MR MR ANKLE*L* W/O CM
3 of 5 series · 8 of 40 positions shown · non-contrast
Comparison: None.

CLINICAL DATA: Chronic left ankle pain.  Pain for 3 months.

EXAM:
MRI OF THE LEFT ANKLE WITHOUT CONTRAST
TECHNIQUE: Multiplanar, multisequence MR imaging of the ankle was performed. No
intravenous contrast was administered.

[Series 3: PD fat-sat · axial · left · 4.0mm · 0.20mm/px · z∈[-23,+73]mm · 3 of 25 slices shown]
[im 5/25]
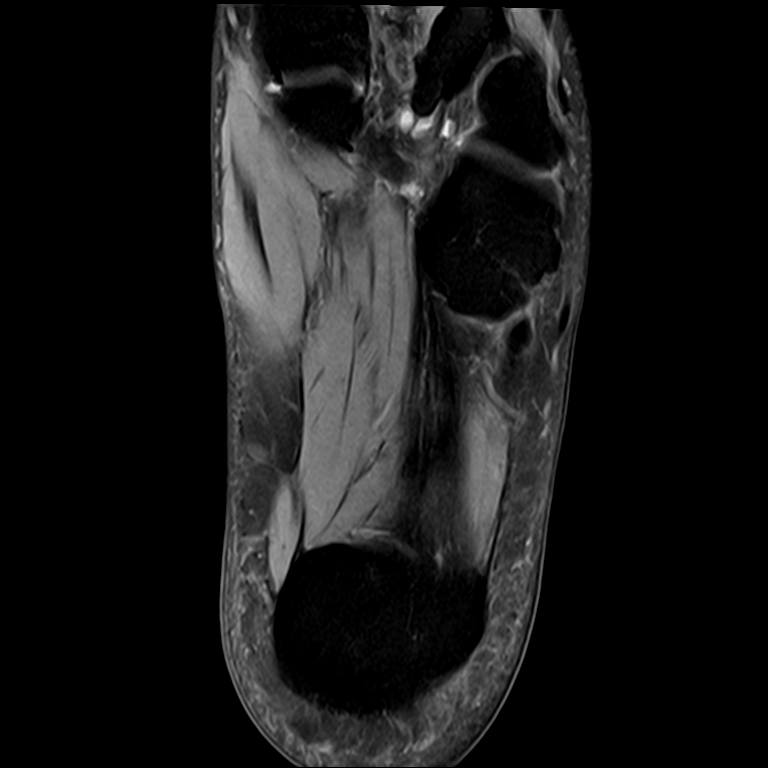
[im 15/25]
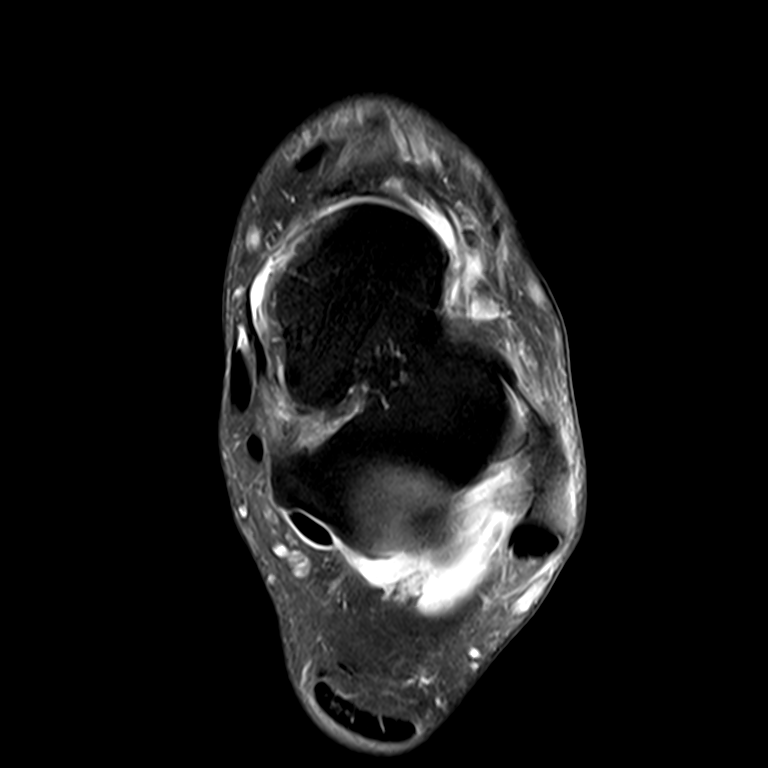
[im 25/25]
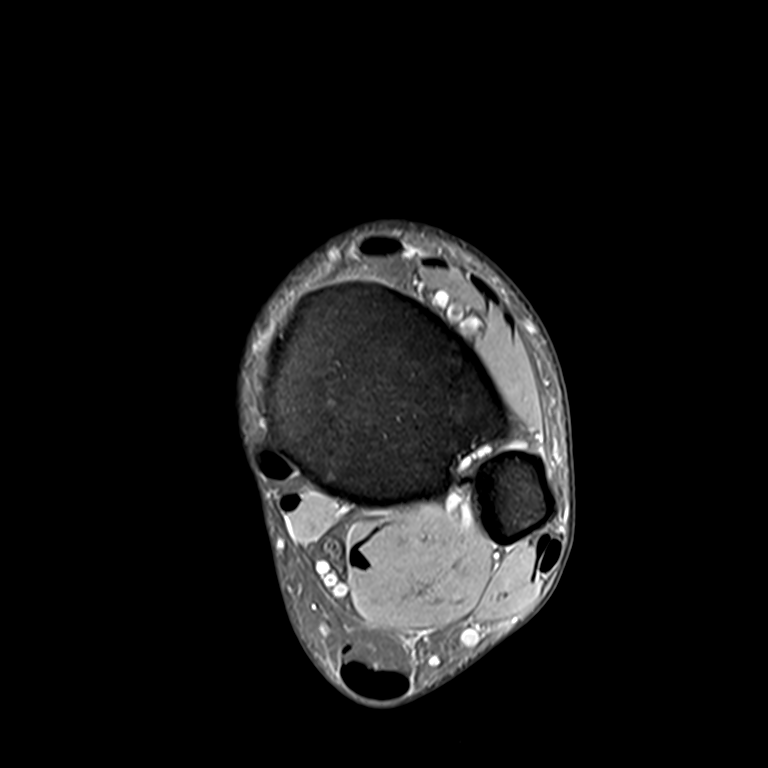

[Series 4: T2 fat-sat · axial · left · 4.0mm · 0.20mm/px · z∈[-23,+54]mm · 3 of 25 slices shown (1 of 2)]
[im 5/25]
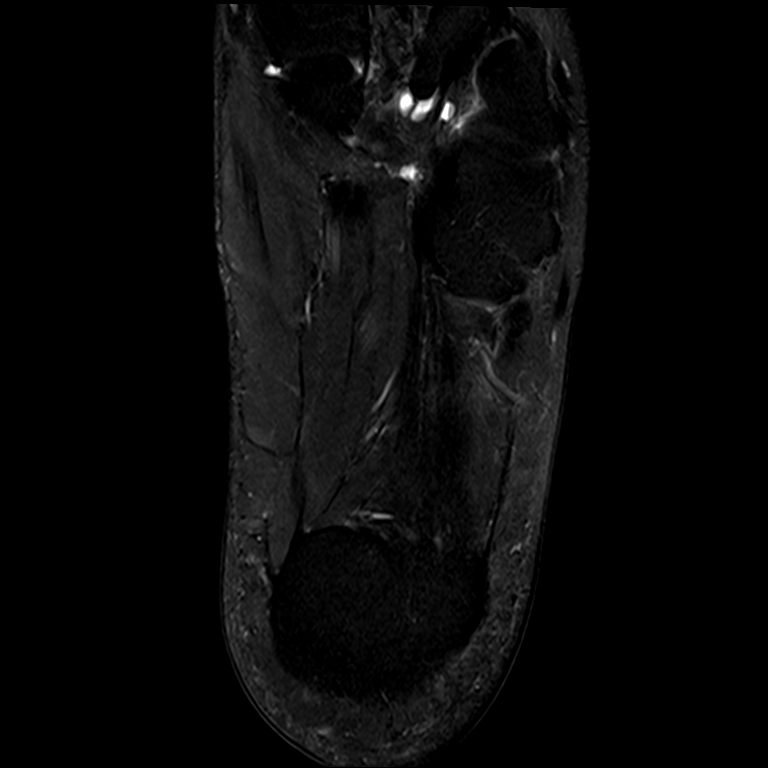
[im 13/25]
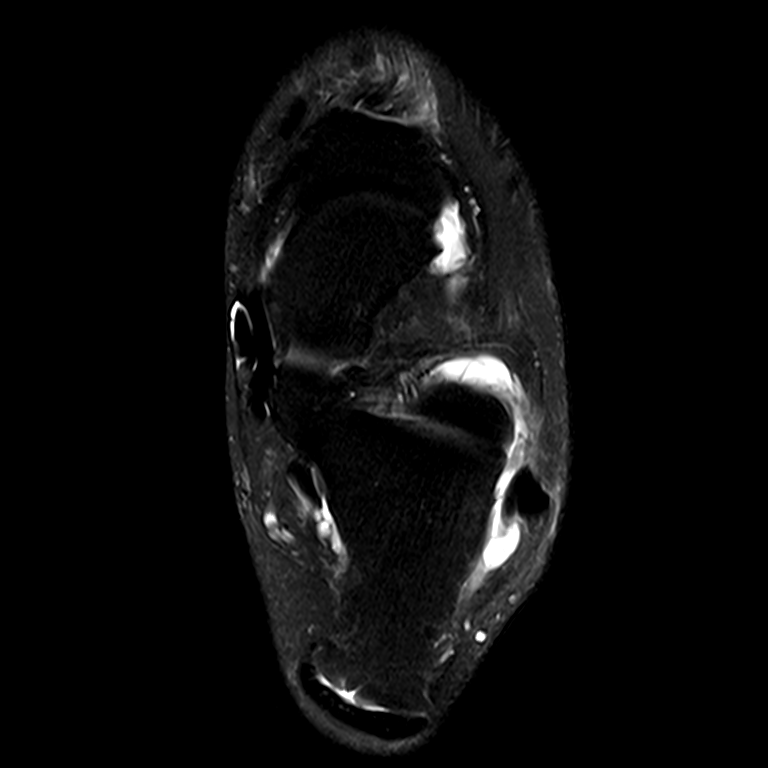
[im 21/25]
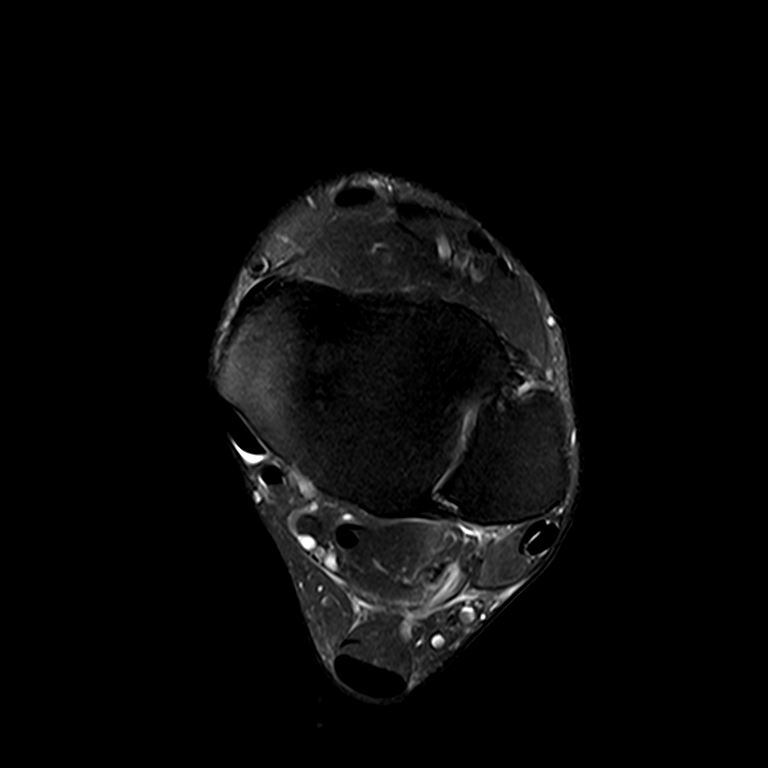

[Series 5: T2 fat-sat · sagittal · left · 2.5mm · 0.21mm/px · 2 of 32 slices shown (2 of 2)]
[im 4/32]
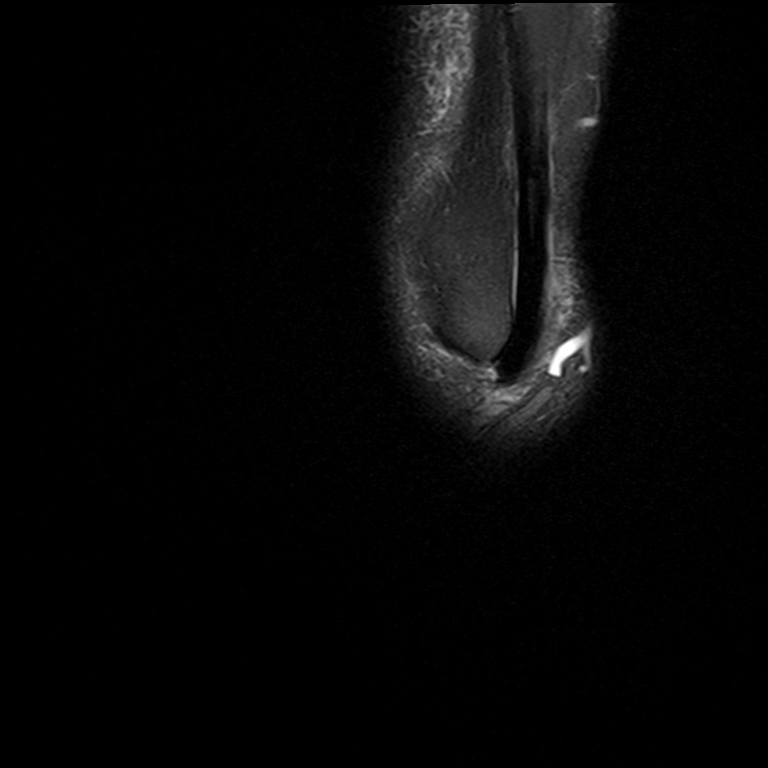
[im 16/32]
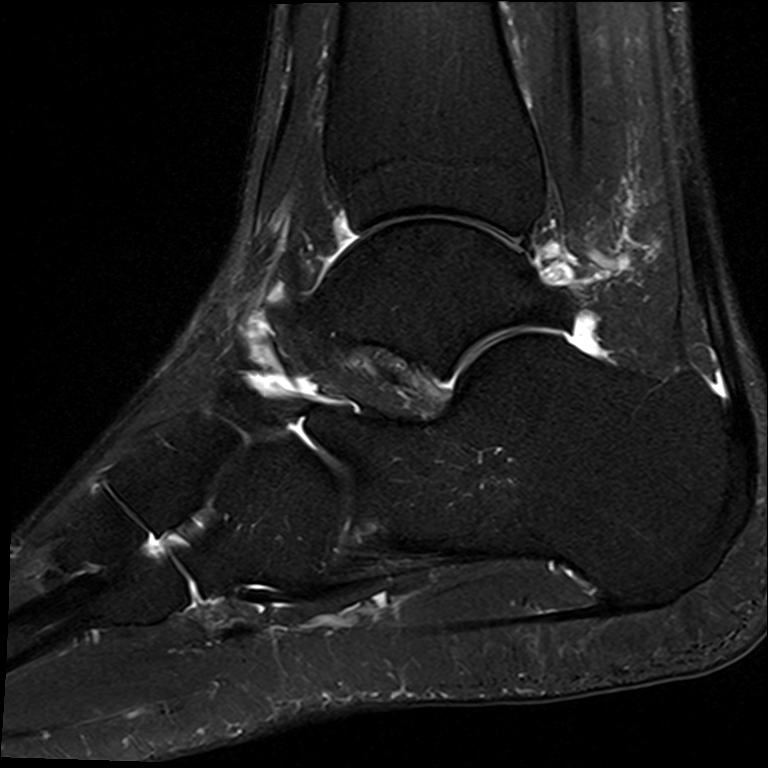

[8 of 40 positions shown; findings below may reference images not displayed]

FINDINGS: TENDONS

Peroneal: Peroneal longus tendon intact. Peroneal brevis intact.

Posteromedial: Posterior tibial tendon intact. Flexor hallucis
longus tendon intact. Flexor digitorum longus tendon intact.

Anterior: Tibialis anterior tendon intact. Extensor hallucis longus
tendon intact Extensor digitorum longus tendon intact.

Achilles: Mild focal tendinosis of the distal lateral Achilles
tendon insertion with minimal subcortical reactive marrow edema.

Plantar Fascia: Intact.

LIGAMENTS

Lateral: Anterior talofibular ligament intact. Calcaneofibular
ligament intact. Posterior talofibular ligament intact. Anterior and
posterior tibiofibular ligaments intact.

Medial: Severe increased signal in the deltoid ligament with
reactive edema in the adjacent medial malleolus most consistent with
severe injury with a partial tear without complete disruption.
Intact spring ligament.

CARTILAGE

Ankle Joint: Small ankle joint effusion. Normal ankle mortise. No
chondral defect.

Subtalar Joints/Sinus Tarsi: Normal subtalar joints. Small posterior
subtalar joint effusion. Normal sinus tarsi.

Bones: No marrow signal abnormality. No fracture or dislocation.
Mild osteoarthritis of the talonavicular joint.

Soft Tissue: No soft tissue mass.  No fluid collection or hematoma.
IMPRESSION: 1. Severe increased signal in the deltoid ligament with reactive
edema in the adjacent medial malleolus most consistent with severe
injury with a partial tear without complete disruption. Intact
spring ligament.
2. Small ankle joint effusion and posterior subtalar joint effusion.
3. Mild osteoarthritis of the talonavicular joint.
4. Mild focal tendinosis of the distal lateral Achilles tendon
insertion with minimal subcortical reactive marrow edema.
